# Patient Record
Sex: Female | Born: 1955 | Race: Black or African American | Hispanic: No | Marital: Single | Smoking: Never smoker
Health system: Southern US, Community
[De-identification: ages and names within clinical notes are randomized; demographics above are authoritative.]

## PROBLEM LIST (undated history)

## (undated) DIAGNOSIS — I10 Essential (primary) hypertension: Secondary | ICD-10-CM

## (undated) DIAGNOSIS — E785 Hyperlipidemia, unspecified: Secondary | ICD-10-CM

## (undated) HISTORY — PX: ABDOMINAL HYSTERECTOMY: SHX81

---

## 2019-11-23 ENCOUNTER — Encounter (HOSPITAL_BASED_OUTPATIENT_CLINIC_OR_DEPARTMENT_OTHER): Payer: Self-pay | Admitting: *Deleted

## 2019-11-23 ENCOUNTER — Other Ambulatory Visit: Payer: Self-pay

## 2019-11-23 ENCOUNTER — Emergency Department (HOSPITAL_BASED_OUTPATIENT_CLINIC_OR_DEPARTMENT_OTHER)
Admission: EM | Admit: 2019-11-23 | Discharge: 2019-11-23 | Disposition: A | Discharge status: Home / Self Care | Payer: BC Managed Care – PPO | Attending: Emergency Medicine | Admitting: Emergency Medicine

## 2019-11-23 ENCOUNTER — Emergency Department (HOSPITAL_BASED_OUTPATIENT_CLINIC_OR_DEPARTMENT_OTHER): Payer: BC Managed Care – PPO

## 2019-11-23 DIAGNOSIS — Z20822 Contact with and (suspected) exposure to covid-19: Secondary | ICD-10-CM | POA: Diagnosis not present

## 2019-11-23 DIAGNOSIS — J069 Acute upper respiratory infection, unspecified: Secondary | ICD-10-CM | POA: Insufficient documentation

## 2019-11-23 DIAGNOSIS — R05 Cough: Secondary | ICD-10-CM | POA: Diagnosis present

## 2019-11-23 DIAGNOSIS — Z79899 Other long term (current) drug therapy: Secondary | ICD-10-CM | POA: Diagnosis not present

## 2019-11-23 DIAGNOSIS — R059 Cough, unspecified: Secondary | ICD-10-CM

## 2019-11-23 LAB — COMPREHENSIVE METABOLIC PANEL
ALT: 16 U/L (ref 0–44)
AST: 14 U/L — ABNORMAL LOW (ref 15–41)
Albumin: 3.6 g/dL (ref 3.5–5.0)
Alkaline Phosphatase: 97 U/L (ref 38–126)
Anion gap: 9 (ref 5–15)
BUN: 16 mg/dL (ref 8–23)
CO2: 27 mmol/L (ref 22–32)
Calcium: 8.8 mg/dL — ABNORMAL LOW (ref 8.9–10.3)
Chloride: 104 mmol/L (ref 98–111)
Creatinine, Ser: 0.66 mg/dL (ref 0.44–1.00)
GFR calc Af Amer: >60 mL/min (ref >60)
GFR calc non Af Amer: >60 mL/min (ref >60)
Glucose, Bld: 89 mg/dL (ref 70–99)
Potassium: 3.6 mmol/L (ref 3.5–5.1)
Sodium: 140 mmol/L (ref 135–145)
Total Bilirubin: 0.5 mg/dL (ref 0.3–1.2)
Total Protein: 7.3 g/dL (ref 6.5–8.1)

## 2019-11-23 LAB — CBC WITH DIFFERENTIAL/PLATELET
Abs Immature Granulocytes: 0.1 10*3/uL — ABNORMAL HIGH (ref 0.00–0.07)
Basophils Absolute: 0.1 10*3/uL (ref 0.0–0.1)
Basophils Relative: 1 %
Eosinophils Absolute: 0.2 10*3/uL (ref 0.0–0.5)
Eosinophils Relative: 1 %
HCT: 38.7 % (ref 36.0–46.0)
Hemoglobin: 12.8 g/dL (ref 12.0–15.0)
Immature Granulocytes: 1 %
Lymphocytes Relative: 26 %
Lymphs Abs: 3.8 10*3/uL (ref 0.7–4.0)
MCH: 29.5 pg (ref 26.0–34.0)
MCHC: 33.1 g/dL (ref 30.0–36.0)
MCV: 89.2 fL (ref 80.0–100.0)
Monocytes Absolute: 1 10*3/uL (ref 0.1–1.0)
Monocytes Relative: 7 %
Neutro Abs: 9.6 10*3/uL — ABNORMAL HIGH (ref 1.7–7.7)
Neutrophils Relative %: 64 %
Platelets: 309 10*3/uL (ref 150–400)
RBC: 4.34 MIL/uL (ref 3.87–5.11)
RDW: 14.3 % (ref 11.5–15.5)
WBC: 14.7 10*3/uL — ABNORMAL HIGH (ref 4.0–10.5)
nRBC: 0 % (ref 0.0–0.2)

## 2019-11-23 LAB — D-DIMER, QUANTITATIVE: D-Dimer, Quant: 0.33 ug/mL-FEU (ref 0.00–0.50)

## 2019-11-23 LAB — TROPONIN I (HIGH SENSITIVITY): Troponin I (High Sensitivity): 4 ng/L (ref <18)

## 2019-11-23 LAB — SARS CORONAVIRUS 2 BY RT PCR (HOSPITAL ORDER, PERFORMED IN ~~LOC~~ HOSPITAL LAB): SARS Coronavirus 2: NEGATIVE

## 2019-11-23 MED ORDER — BENZONATATE 100 MG PO CAPS: 100.0000 mg | ORAL_CAPSULE | Freq: Three times a day (TID) | ORAL | 0 refills | Status: AC

## 2019-11-23 MED ORDER — FLUTICASONE PROPIONATE 50 MCG/ACT NA SUSP
1.0000 | Freq: Every day | NASAL | 0 refills | Status: AC
Start: 2019-11-23 — End: ?

## 2019-11-23 MED ORDER — ALBUTEROL SULFATE HFA 108 (90 BASE) MCG/ACT IN AERS
2.0000 | INHALATION_SPRAY | Freq: Once | RESPIRATORY_TRACT | Status: AC
  Administered 2019-11-23: 2 via RESPIRATORY_TRACT
  Filled 2019-11-23: qty 6.7

## 2019-11-23 NOTE — Discharge Instructions (Signed)
You likely have a viral illness.  This should be treated symptomatically. Use the albuterol inhaler every 4 hours for the next 2 days.  After this, use as needed for shortness of breath, wheezing, or coughing fits. Use Tylenol or ibuprofen as needed for chest pain, body aches, or fever. Use Flonase daily for nasal congestion and cough. Use tessalon to help with cough Make sure you stay well-hydrated with water. Wash your hands frequently to prevent spread of infection. Follow-up with your primary care doctor in 1 week if your symptoms are not improving. Return to the emergency room if you develop severe/worsening chest pain, difficulty breathing, or any new or worsening symptoms.

## 2019-11-23 NOTE — ED Triage Notes (Signed)
Cough x 5 days. Sore throat. Runny nose, chills, headache. She was seen by her MD 2 weeks ago and started on Prednisone for inflammation in her chest.

## 2019-11-23 NOTE — ED Provider Notes (Signed)
[facility] EMERGENCY DEPARTMENT Provider Note   CSN: [id number] Arrival date & time: 11/23/19  1148     History Chief Complaint  Patient presents with  . Cough    Renee Patel is a 64 y.o. female presenting for evaluation of cough, left-sided chest pain, nasal congestion, and headache.  Patient states 2 weeks ago she developed left-sided chest pain that radiates to her back.  It is constant, nothing makes it better or worse.  She is seen by her PCP, had a normal x-ray and EKG.  It was presumed to be pleurisy, and she was started on prednisone.  Patient says she took the entire course, without improvement of her symptoms.  She was documenting albuterol or antibiotics at that time.  5 days ago she developed a cough productive mucus, nasal congestion, headache.  She has not been taking anything for her symptoms.  As her symptoms not improve, is encouraged that she come to the ER for further evaluation.  She denies fevers or chills.  She denies shortness of breath.  No increased pain with inspiration.  She denies sick contacts.  She has received both Covid vaccines more than 2 weeks ago.  She denies a history of asthma or COPD, however has not needed to use albuterol in the past for bronchitis.  He denies recent travel, surgeries, immobilization, history of cancer, history of previous DVT/PE, or hormone use.  HPI     History reviewed. No pertinent past medical history.  There are no problems to display for this patient.   Past Surgical History:  Procedure Laterality Date  . ABDOMINAL HYSTERECTOMY       OB History   No obstetric history on file.     No family history on file.  Social History   Tobacco Use  . Smoking status: Never Smoker  . Smokeless tobacco: Never Used  Substance Use Topics  . Alcohol use: Never  . Drug use: Never    Home Medications Prior to Admission medications   Medication Sig Start Date End Date Taking? Authorizing Provider  benzonatate  (TESSALON) 100 MG capsule Take 1 capsule (100 mg total) by mouth every 8 (eight) hours. 11/23/19   Aiko Belko, PA-C  fluticasone (FLONASE) 50 MCG/ACT nasal spray Place 1 spray into both nostrils daily. 11/23/19   Khailee Mick, PA-C    Allergies    Patient has no known allergies.  Review of Systems   Review of Systems  HENT: Positive for congestion.   Respiratory: Positive for cough.   Cardiovascular: Positive for chest pain.  Neurological: Positive for headaches.  All other systems reviewed and are negative.   Physical Exam Updated Vital Signs BP (!) 169/95   Pulse 67   Temp 98.7 F (37.1 C) (Oral)   Resp 19   Ht 5\' 7"  (1.702 m)   Wt 81.6 kg   SpO2 100%   BMI 28.19 kg/m   Physical Exam Vitals and nursing note reviewed.  Constitutional:      General: She is not in acute distress.    Appearance: She is well-developed.     Comments: Appears nontoxic  HENT:     Head: Normocephalic and atraumatic.     Comments: OP clear without tonsillar swelling or exudate.  Uvula midline with equal palate rise. Eyes:     Extraocular Movements: Extraocular movements intact.     Conjunctiva/sclera: Conjunctivae normal.     Pupils: Pupils are equal, round, and reactive to light.  Cardiovascular:  Rate and Rhythm: Normal rate and regular rhythm.     Pulses: Normal pulses.  Pulmonary:     Effort: Pulmonary effort is normal. No respiratory distress.     Breath sounds: Normal breath sounds. No wheezing.     Comments: Speaking full sentences.  Clear lung sounds in all fields.  No wheezing, rales, rhonchi.  With inspiration, patient has coughing fits.  Mild tenderness palpation of the anterior chest wall on the left side. Chest:     Chest wall: Tenderness present.  Abdominal:     General: There is no distension.     Palpations: Abdomen is soft. There is no mass.     Tenderness: There is no abdominal tenderness. There is no guarding or rebound.  Musculoskeletal:        General:  Normal range of motion.     Cervical back: Normal range of motion and neck supple.     Right lower leg: No edema.     Left lower leg: No edema.     Comments: No leg pain or swelling  Skin:    General: Skin is warm and dry.     Capillary Refill: Capillary refill takes less than 2 seconds.  Neurological:     Mental Status: She is alert and oriented to person, place, and time.     ED Results / Procedures / Treatments   Labs (all labs ordered are listed, but only abnormal results are displayed) Labs Reviewed  CBC WITH DIFFERENTIAL/PLATELET - Abnormal; Notable for the following components:      Result Value   WBC 14.7 (*)    Neutro Abs 9.6 (*)    Abs Immature Granulocytes 0.10 (*)    All other components within normal limits  COMPREHENSIVE METABOLIC PANEL - Abnormal; Notable for the following components:   Calcium 8.8 (*)    AST 14 (*)    All other components within normal limits  SARS CORONAVIRUS 2 BY RT PCR (HOSPITAL ORDER, PERFORMED IN [facility])  D-DIMER, QUANTITATIVE (NOT AT [facility])  TROPONIN I (HIGH SENSITIVITY)  TROPONIN I (HIGH SENSITIVITY)    EKG None  Radiology DG Chest Port 1 View  Result Date: 11/23/2019 CLINICAL DATA:  Cough and chills EXAM: PORTABLE CHEST 1 VIEW COMPARISON:  Nov 04, 2019 FINDINGS: The lungs are clear. The heart size and pulmonary vascularity are normal. No adenopathy. No bone lesions. IMPRESSION: Lungs clear.  Cardiac silhouette within normal limits. Electronically Signed   By: Bretta Bang III M.D.   On: 11/23/2019 13:09    Procedures Procedures (including critical care time)  Medications Ordered in ED Medications  albuterol (VENTOLIN HFA) 108 (90 Base) MCG/ACT inhaler 2 puff (2 puffs Inhalation Given 11/23/19 1311)    ED Course  I have reviewed the triage vital signs and the nursing notes.  Pertinent labs & imaging results that were available during my care of the patient were reviewed by me and considered in my medical  decision making (see chart for details).    MDM Rules/Calculators/A&P                      Patient presented for evaluation of cough, headache, nasal congestion, and chest pain.  On exam, patient appears nontoxic.  Pain is reproducible with palpation of the chest wall, and has been present for several weeks.  As such, likely pleurisy/inflammation, especially in the setting of cough and nasal congestion.  However, symptoms are not improving, will obtain D-dimer to  rule out PE.  Low suspicion for ACS, will rule out with EKG and troponin.  Will repeat chest x-ray today, as patient has new symptoms of cough to rule out pneumonia.  Will give albuterol and reassess.  X-ray viewed interpreted by me, no pneumonia, no thorax, effusion, cardiomegaly.  D-dimer negative.  Initial troponin IV, EKG without STEMI.  As such, I do not believe she needs repeat troponin, several suspicion for ACS.  Labs otherwise reassuring.  On reassessment after albuterol, patient reports significant improvement in cough.  Likely bronchitis/other viral illness.  Covid test is negative.  As patient is afebrile, I do not believe she needs antibiotics.  Discussed symptomatic treatment with albuterol, Tessalon, nasal sprays.  Discussed follow-up with primary care as needed if symptoms not proving.  At this time, patient appears safe for discharge.  Return precautions given.  Patient states she understands and agrees to plan.   Final Clinical Impression(s) / ED Diagnoses Final diagnoses:  Cough  Upper respiratory tract infection, unspecified type    Rx / DC Orders ED Discharge Orders         Ordered    fluticasone (FLONASE) 50 MCG/ACT nasal spray  Daily     11/23/19 1416    benzonatate (TESSALON) 100 MG capsule  Every 8 hours     11/23/19 1416           Adaline Trejos, PA-C 11/23/19 1444    Tegeler, Canary Brim, MD 11/23/19 1520

## 2020-05-22 ENCOUNTER — Other Ambulatory Visit: Payer: Self-pay

## 2020-05-22 ENCOUNTER — Encounter (HOSPITAL_BASED_OUTPATIENT_CLINIC_OR_DEPARTMENT_OTHER): Payer: Self-pay | Admitting: Emergency Medicine

## 2020-05-22 ENCOUNTER — Emergency Department (HOSPITAL_BASED_OUTPATIENT_CLINIC_OR_DEPARTMENT_OTHER)
Admission: EM | Admit: 2020-05-22 | Discharge: 2020-05-22 | Disposition: A | Discharge status: Home / Self Care | Payer: BC Managed Care – PPO | Attending: Emergency Medicine | Admitting: Emergency Medicine

## 2020-05-22 DIAGNOSIS — M545 Low back pain, unspecified: Secondary | ICD-10-CM

## 2020-05-22 DIAGNOSIS — M62830 Muscle spasm of back: Secondary | ICD-10-CM | POA: Diagnosis not present

## 2020-05-22 DIAGNOSIS — M62838 Other muscle spasm: Secondary | ICD-10-CM

## 2020-05-22 MED ORDER — HYDROCODONE-ACETAMINOPHEN 5-325 MG PO TABS: 1.0000 | ORAL_TABLET | ORAL | 0 refills | Status: AC | PRN

## 2020-05-22 MED ORDER — HYDROMORPHONE HCL 1 MG/ML IJ SOLN
1.0000 mg | Freq: Once | INTRAMUSCULAR | Status: AC
  Administered 2020-05-22: 1 mg via INTRAMUSCULAR
  Filled 2020-05-22: qty 1

## 2020-05-22 MED ORDER — KETOROLAC TROMETHAMINE 60 MG/2ML IM SOLN
60.0000 mg | Freq: Once | INTRAMUSCULAR | Status: AC
  Administered 2020-05-22: 60 mg via INTRAMUSCULAR
  Filled 2020-05-22: qty 2

## 2020-05-22 MED ORDER — METHOCARBAMOL 500 MG PO TABS: 500.0000 mg | ORAL_TABLET | Freq: Four times a day (QID) | ORAL | 0 refills | Status: AC

## 2020-05-22 NOTE — ED Provider Notes (Signed)
[facility] EMERGENCY DEPARTMENT Provider Note   CSN: [id number] Arrival date & time: 05/22/20  2101     History Chief Complaint  Patient presents with  . Back Pain    Renee Patel is a 64 y.o. female.  The history is provided by the patient.  Back Pain Location:  Lumbar spine Quality:  Aching Pain severity:  Severe Pain is:  Same all the time Timing:  Constant Progression:  Worsening Chronicity:  New Relieved by:  Nothing Worsened by:  Nothing Ineffective treatments:  Ibuprofen and muscle relaxants Associated symptoms: no weakness   Risk factors: no recent surgery   Pt complains of pain in her back.  Pt thinks she may have pulled something.  Pt complains of pain upper left back to lower back.  Pain with movement. nv and ns intact     History reviewed. No pertinent past medical history.  There are no problems to display for this patient.   Past Surgical History:  Procedure Laterality Date  . ABDOMINAL HYSTERECTOMY       OB History   No obstetric history on file.     No family history on file.  Social History   Tobacco Use  . Smoking status: Never Smoker  . Smokeless tobacco: Never Used  Substance Use Topics  . Alcohol use: Never  . Drug use: Never    Home Medications Prior to Admission medications   Medication Sig Start Date End Date Taking? Authorizing Provider  benzonatate (TESSALON) 100 MG capsule Take 1 capsule (100 mg total) by mouth every 8 (eight) hours. 11/23/19   Caccavale, Sophia, PA-C  fluticasone (FLONASE) 50 MCG/ACT nasal spray Place 1 spray into both nostrils daily. 11/23/19   Caccavale, Sophia, PA-C    Allergies    Patient has no known allergies.  Review of Systems   Review of Systems  Musculoskeletal: Positive for back pain.  Neurological: Negative for weakness.  All other systems reviewed and are negative.   Physical Exam Updated Vital Signs BP (!) 151/90 (BP Location: Left Arm)   Pulse 87   Temp 98.2 F (36.8  C) (Oral)   Resp 18   Ht 5' 7.5" (1.715 m)   Wt 83.9 kg   SpO2 98%   BMI 28.55 kg/m   Physical Exam Vitals and nursing note reviewed.  Constitutional:      Appearance: She is well-developed.  HENT:     Head: Normocephalic.  Cardiovascular:     Rate and Rhythm: Normal rate.  Pulmonary:     Effort: Pulmonary effort is normal.  Abdominal:     General: There is no distension.  Musculoskeletal:        General: Normal range of motion.     Cervical back: Normal range of motion.  Skin:    General: Skin is warm.  Neurological:     General: No focal deficit present.     Mental Status: She is alert and oriented to person, place, and time.  Psychiatric:        Mood and Affect: Mood normal.     ED Results / Procedures / Treatments   Labs (all labs ordered are listed, but only abnormal results are displayed) Labs Reviewed - No data to display  EKG None  Radiology No results found.  Procedures Procedures (including critical care time)  Medications Ordered in ED Medications  ketorolac (TORADOL) injection 60 mg (60 mg Intramuscular Given 05/22/20 2203)  HYDROmorphone (DILAUDID) injection 1 mg (1 mg Intramuscular Given 05/22/20  2203)    ED Course  I have reviewed the triage vital signs and the nursing notes.  Pertinent labs & imaging results that were available during my care of the patient were reviewed by me and considered in my medical decision making (see chart for details).    MDM Rules/Calculators/A&P                          MDM:  Pt reports feeling better,  Pt reexamined.  No rash.  Pt advised to follow up with her MD for recheck in 2-3 dasys  Final Clinical Impression(s) / ED Diagnoses Final diagnoses:  Acute low back pain without sciatica, unspecified back pain laterality  Muscle spasm    Rx / DC Orders ED Discharge Orders         Ordered    HYDROcodone-acetaminophen (NORCO/VICODIN) 5-325 MG tablet  Every 4 hours PRN        05/22/20 2311     methocarbamol (ROBAXIN) 500 MG tablet  4 times daily        05/22/20 2311        An After Visit Summary was printed and given to the patient.    Osie Cheeks 05/22/20 2312    Benjiman Core, MD 05/22/20 2342

## 2020-05-22 NOTE — ED Triage Notes (Signed)
Reports left sided back pain for the last two weeks.  Endorses helping her mother with ADL's which may have pulled something,  Pain described as spasms.

## 2021-10-06 IMAGING — DX DG CHEST 1V PORT
1 series · 1 of 1 positions shown · non-contrast
Comparison: November 04, 2019

CLINICAL DATA: Cough and chills

EXAM:
PORTABLE CHEST 1 VIEW

[chest ap]
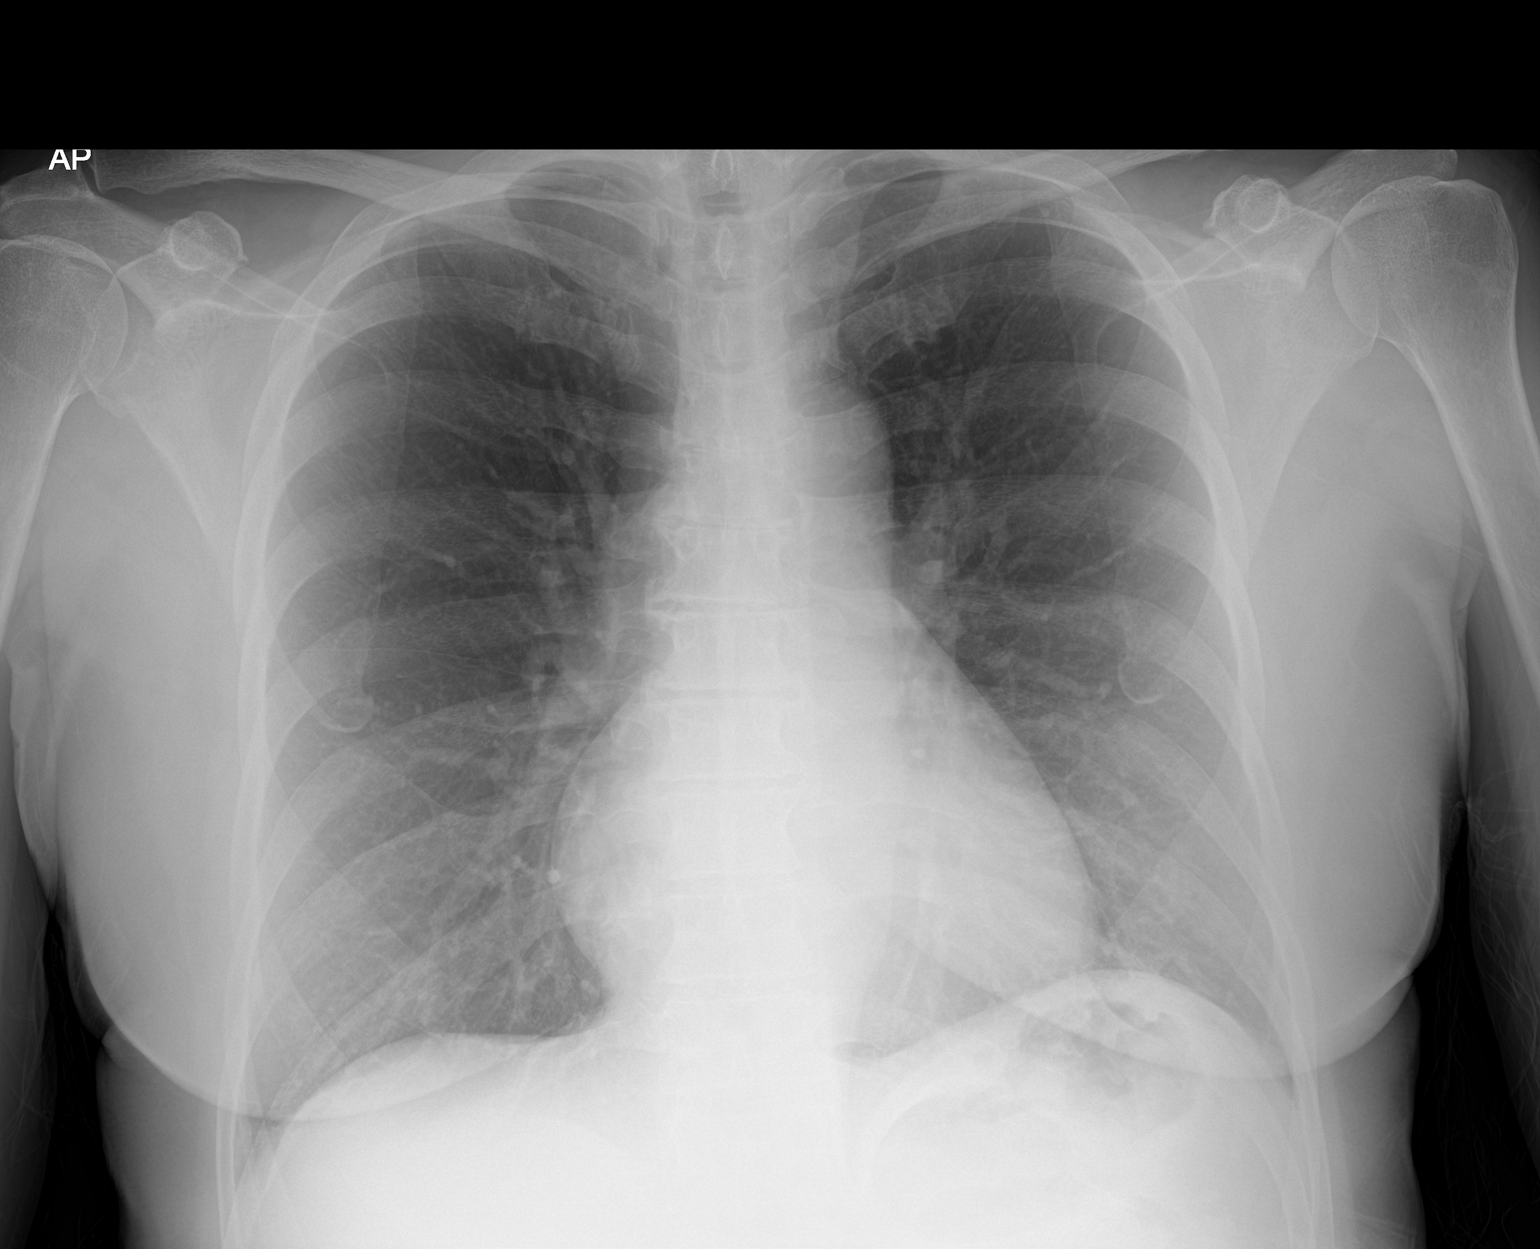

[1 of 1 positions shown; findings below may reference images not displayed]

FINDINGS: The lungs are clear. The heart size and pulmonary vascularity are
normal. No adenopathy. No bone lesions.
IMPRESSION: Lungs clear.  Cardiac silhouette within normal limits.

## 2022-07-28 ENCOUNTER — Encounter (HOSPITAL_BASED_OUTPATIENT_CLINIC_OR_DEPARTMENT_OTHER): Payer: Self-pay | Admitting: Emergency Medicine

## 2022-07-28 ENCOUNTER — Other Ambulatory Visit: Payer: Self-pay

## 2022-07-28 ENCOUNTER — Emergency Department (HOSPITAL_BASED_OUTPATIENT_CLINIC_OR_DEPARTMENT_OTHER)
Admission: EM | Admit: 2022-07-28 | Discharge: 2022-07-28 | Disposition: A | Discharge status: Home / Self Care | Payer: BLUE CROSS/BLUE SHIELD | Attending: Emergency Medicine | Admitting: Emergency Medicine

## 2022-07-28 DIAGNOSIS — M545 Low back pain, unspecified: Secondary | ICD-10-CM | POA: Diagnosis present

## 2022-07-28 DIAGNOSIS — M5441 Lumbago with sciatica, right side: Secondary | ICD-10-CM | POA: Diagnosis not present

## 2022-07-28 DIAGNOSIS — M5431 Sciatica, right side: Secondary | ICD-10-CM

## 2022-07-28 HISTORY — DX: Hyperlipidemia, unspecified: E78.5

## 2022-07-28 HISTORY — DX: Essential (primary) hypertension: I10

## 2022-07-28 MED ORDER — METHYLPREDNISOLONE 4 MG PO TBPK: ORAL_TABLET | ORAL | 0 refills | Status: AC

## 2022-07-28 MED ORDER — OXYCODONE HCL 5 MG PO TABS
5.0000 mg | ORAL_TABLET | Freq: Once | ORAL | Status: AC
  Administered 2022-07-28: 5 mg via ORAL
  Filled 2022-07-28: qty 1

## 2022-07-28 MED ORDER — DIAZEPAM 2 MG PO TABS
2.0000 mg | ORAL_TABLET | Freq: Once | ORAL | Status: AC
  Administered 2022-07-28: 2 mg via ORAL
  Filled 2022-07-28: qty 1

## 2022-07-28 MED ORDER — ACETAMINOPHEN 500 MG PO TABS
1000.0000 mg | ORAL_TABLET | Freq: Once | ORAL | Status: AC
  Administered 2022-07-28: 1000 mg via ORAL
  Filled 2022-07-28: qty 2

## 2022-07-28 MED ORDER — KETOROLAC TROMETHAMINE 15 MG/ML IJ SOLN
15.0000 mg | Freq: Once | INTRAMUSCULAR | Status: AC
  Administered 2022-07-28: 15 mg via INTRAMUSCULAR
  Filled 2022-07-28: qty 1

## 2022-07-28 NOTE — ED Provider Notes (Signed)
[facility] Provider Note   CSN: [id number] Arrival date & time: 07/28/22  1314     History  Chief Complaint  Patient presents with   Back Pain    Renee Patel is a 67 y.o. female.  67 yo F with a cc of right-sided low back pain.  This been going on for a few days now.  She tells me that she woke up and felt like a catch in her back and then she tried to do some lunges and strengthen the area and ended up having some worsening pain.  Radiates down the leg.  She denies trauma denies loss of bowel or bladder denies  Sensation denies numbness or weakness to the leg.   Back Pain      Home Medications Prior to Admission medications   Medication Sig Start Date End Date Taking? Authorizing Provider  methylPREDNISolone (MEDROL DOSEPAK) 4 MG TBPK tablet Day 1: 8mg  before breakfast, 4 mg after lunch, 4 mg after supper, and 8 mg at bedtime Day 2: 4 mg before breakfast, 4 mg after lunch, 4 mg  after supper, and 8 mg  at bedtime Day 3:  4 mg  before breakfast, 4 mg  after lunch, 4 mg after supper, and 4 mg  at bedtime Day 4: 4 mg  before breakfast, 4 mg  after lunch, and 4 mg at bedtime Day 5: 4 mg  before breakfast and 4 mg at bedtime Day 6: 4 mg  before breakfast 07/28/22  Yes Deno Etienne, DO  benzonatate (TESSALON) 100 MG capsule Take 1 capsule (100 mg total) by mouth every 8 (eight) hours. 11/23/19   Caccavale, Sophia, PA-C  fluticasone (FLONASE) 50 MCG/ACT nasal spray Place 1 spray into both nostrils daily. 11/23/19   Caccavale, Sophia, PA-C  methocarbamol (ROBAXIN) 500 MG tablet Take 1 tablet (500 mg total) by mouth 4 (four) times daily. 05/22/20   Fransico Meadow, PA-C      Allergies    Nabumetone    Review of Systems   Review of Systems  Musculoskeletal:  Positive for back pain.    Physical Exam Updated Vital Signs BP (!) 132/92 (BP Location: Right Arm)   Pulse (!) 104   Temp 98.9 F (37.2 C) (Oral)   Resp 17   Ht 5\' 7"  (1.702 m)    Wt 78.9 kg   SpO2 98%   BMI 27.25 kg/m  Physical Exam Vitals and nursing note reviewed.  Constitutional:      General: She is not in acute distress.    Appearance: She is well-developed. She is not diaphoretic.  HENT:     Head: Normocephalic and atraumatic.  Eyes:     Pupils: Pupils are equal, round, and reactive to light.  Cardiovascular:     Rate and Rhythm: Normal rate and regular rhythm.     Heart sounds: No murmur heard.    No friction rub. No gallop.  Pulmonary:     Effort: Pulmonary effort is normal.     Breath sounds: No wheezing or rales.  Abdominal:     General: There is no distension.     Palpations: Abdomen is soft.     Tenderness: There is no abdominal tenderness.  Musculoskeletal:        General: No tenderness.     Cervical back: Normal range of motion and neck supple.     Comments: Pulse motor and sensation intact in the right lower extremity.  Reflexes are  2+ and equal.  No clonus.  Skin:    General: Skin is warm and dry.  Neurological:     Mental Status: She is alert and oriented to person, place, and time.  Psychiatric:        Behavior: Behavior normal.     ED Results / Procedures / Treatments   Labs (all labs ordered are listed, but only abnormal results are displayed) Labs Reviewed - No data to display  EKG None  Radiology No results found.  Procedures Procedures    Medications Ordered in ED Medications  acetaminophen (TYLENOL) tablet 1,000 mg (has no administration in time range)  oxyCODONE (Oxy IR/ROXICODONE) immediate release tablet 5 mg (has no administration in time range)  ketorolac (TORADOL) 15 MG/ML injection 15 mg (has no administration in time range)  diazepam (VALIUM) tablet 2 mg (has no administration in time range)    ED Course/ Medical Decision Making/ A&P                             Medical Decision Making Risk OTC drugs. Prescription drug management.   67 yo F with a chief complaints of right-sided low back pain  that radiates down the leg.  This has been going on for a few days now.  She was having some initial discomfort and then try to do some lunges to see if that would help but it made things worse.  She has a benign exam here.  Is atraumatic.  Will treat as musculoskeletal.  Burst dose steroids if she has had no improvement with meloxicam at home.  PCP follow-up.  1:38 PM:  I have discussed the diagnosis/risks/treatment options with the patient and family.  Evaluation and diagnostic testing in the emergency department does not suggest an emergent condition requiring admission or immediate intervention beyond what has been performed at this time.  They will follow up with PCP. We also discussed returning to the ED immediately if new or worsening sx occur. We discussed the sx which are most concerning (e.g., sudden worsening pain, fever, inability to tolerate by mouth, cauda equina s/sx) that necessitate immediate return. Medications administered to the patient during their visit and any new prescriptions provided to the patient are listed below.  Medications given during this visit Medications  acetaminophen (TYLENOL) tablet 1,000 mg (has no administration in time range)  oxyCODONE (Oxy IR/ROXICODONE) immediate release tablet 5 mg (has no administration in time range)  ketorolac (TORADOL) 15 MG/ML injection 15 mg (has no administration in time range)  diazepam (VALIUM) tablet 2 mg (has no administration in time range)     The patient appears reasonably screen and/or stabilized for discharge and I doubt any other medical condition or other [facility] requiring further screening, evaluation, or treatment in the ED at this time prior to discharge.          Final Clinical Impression(s) / ED Diagnoses Final diagnoses:  Sciatica of right side    Rx / DC Orders ED Discharge Orders          Ordered    methylPREDNISolone (MEDROL DOSEPAK) 4 MG TBPK tablet        07/28/22 1335              Deno Etienne, DO 07/28/22 1338

## 2022-07-28 NOTE — ED Notes (Signed)
ED Provider at bedside. 

## 2022-07-28 NOTE — Discharge Instructions (Addendum)
Your back pain is most likely due to a muscular strain.  There is been a lot of research on back pain, unfortunately the only thing that seems to really help is Tylenol and ibuprofen.  Relative rest is also important to not lift greater than 10 pounds bending or twisting at the waist.  Please follow-up with your family physician.  The other thing that really seems to benefit patients is physical therapy which your doctor may send you for.  Please return to the emergency department for new numbness or weakness to your arms or legs. Difficulty with urinating or urinating or pooping on yourself.  Also if you cannot feel toilet paper when you wipe or get a fever.   Take your steroids as prescribed.  You should not take any NSAIDs while you are taking the steroid as this can significantly increase your risk of reflux disease. Also take tylenol 1000mg (2 extra strength) four times a day.   Stretches tend to help quite a bit [email or web address]

## 2022-07-28 NOTE — ED Triage Notes (Signed)
Pt with RT side lower back pain since Wed, now radiating down leg; no injury

## 2023-09-09 DIAGNOSIS — R0600 Dyspnea, unspecified: Secondary | ICD-10-CM | POA: Diagnosis not present

## 2023-10-10 DIAGNOSIS — R002 Palpitations: Secondary | ICD-10-CM | POA: Diagnosis not present

## 2023-10-10 DIAGNOSIS — K8020 Calculus of gallbladder without cholecystitis without obstruction: Secondary | ICD-10-CM | POA: Diagnosis not present

## 2023-10-10 DIAGNOSIS — I10 Essential (primary) hypertension: Secondary | ICD-10-CM | POA: Diagnosis not present

## 2023-10-10 DIAGNOSIS — G4733 Obstructive sleep apnea (adult) (pediatric): Secondary | ICD-10-CM | POA: Diagnosis not present

## 2023-10-10 DIAGNOSIS — R0602 Shortness of breath: Secondary | ICD-10-CM | POA: Diagnosis not present

## 2023-11-19 DIAGNOSIS — K582 Mixed irritable bowel syndrome: Secondary | ICD-10-CM | POA: Diagnosis not present

## 2023-11-19 DIAGNOSIS — E785 Hyperlipidemia, unspecified: Secondary | ICD-10-CM | POA: Diagnosis not present

## 2023-11-19 DIAGNOSIS — Z8719 Personal history of other diseases of the digestive system: Secondary | ICD-10-CM | POA: Diagnosis not present

## 2023-11-19 DIAGNOSIS — Z6830 Body mass index (BMI) 30.0-30.9, adult: Secondary | ICD-10-CM | POA: Diagnosis not present

## 2023-11-19 DIAGNOSIS — I10 Essential (primary) hypertension: Secondary | ICD-10-CM | POA: Diagnosis not present

## 2023-11-19 DIAGNOSIS — J302 Other seasonal allergic rhinitis: Secondary | ICD-10-CM | POA: Diagnosis not present

## 2023-11-19 DIAGNOSIS — R000 Tachycardia, unspecified: Secondary | ICD-10-CM | POA: Diagnosis not present

## 2023-11-19 DIAGNOSIS — G4733 Obstructive sleep apnea (adult) (pediatric): Secondary | ICD-10-CM | POA: Diagnosis not present

## 2023-11-19 DIAGNOSIS — E669 Obesity, unspecified: Secondary | ICD-10-CM | POA: Diagnosis not present

## 2023-11-19 DIAGNOSIS — Z0000 Encounter for general adult medical examination without abnormal findings: Secondary | ICD-10-CM | POA: Diagnosis not present

## 2023-12-06 DIAGNOSIS — M8589 Other specified disorders of bone density and structure, multiple sites: Secondary | ICD-10-CM | POA: Diagnosis not present

## 2023-12-06 DIAGNOSIS — R600 Localized edema: Secondary | ICD-10-CM | POA: Diagnosis not present

## 2023-12-06 DIAGNOSIS — I10 Essential (primary) hypertension: Secondary | ICD-10-CM | POA: Diagnosis not present

## 2023-12-06 DIAGNOSIS — R002 Palpitations: Secondary | ICD-10-CM | POA: Diagnosis not present

## 2023-12-20 DIAGNOSIS — R350 Frequency of micturition: Secondary | ICD-10-CM | POA: Diagnosis not present

## 2023-12-20 DIAGNOSIS — R319 Hematuria, unspecified: Secondary | ICD-10-CM | POA: Diagnosis not present

## 2023-12-20 DIAGNOSIS — N952 Postmenopausal atrophic vaginitis: Secondary | ICD-10-CM | POA: Diagnosis not present

## 2023-12-20 DIAGNOSIS — R1031 Right lower quadrant pain: Secondary | ICD-10-CM | POA: Diagnosis not present

## 2023-12-20 DIAGNOSIS — M79671 Pain in right foot: Secondary | ICD-10-CM | POA: Diagnosis not present

## 2023-12-20 DIAGNOSIS — R3915 Urgency of urination: Secondary | ICD-10-CM | POA: Diagnosis not present

## 2023-12-20 DIAGNOSIS — M79672 Pain in left foot: Secondary | ICD-10-CM | POA: Diagnosis not present

## 2024-01-13 NOTE — Progress Notes (Signed)
 7 Day Zio patch applied per Thersia Pizza, MD for palpitations.  Zio patch was applied by Meade Lied, CMA.

## 2024-01-16 ENCOUNTER — Emergency Department (HOSPITAL_BASED_OUTPATIENT_CLINIC_OR_DEPARTMENT_OTHER)
Admission: EM | Admit: 2024-01-16 | Discharge: 2024-01-16 | Disposition: A | Discharge status: Home / Self Care | Payer: Medicare (Managed Care) | Source: Ambulatory Visit | Attending: Emergency Medicine | Admitting: Emergency Medicine

## 2024-01-16 ENCOUNTER — Encounter (HOSPITAL_BASED_OUTPATIENT_CLINIC_OR_DEPARTMENT_OTHER): Payer: Self-pay

## 2024-01-16 ENCOUNTER — Emergency Department (HOSPITAL_BASED_OUTPATIENT_CLINIC_OR_DEPARTMENT_OTHER): Payer: Medicare (Managed Care)

## 2024-01-16 ENCOUNTER — Other Ambulatory Visit: Payer: Self-pay

## 2024-01-16 DIAGNOSIS — K1121 Acute sialoadenitis: Secondary | ICD-10-CM | POA: Insufficient documentation

## 2024-01-16 DIAGNOSIS — K1120 Sialoadenitis, unspecified: Secondary | ICD-10-CM | POA: Diagnosis not present

## 2024-01-16 DIAGNOSIS — M799 Soft tissue disorder, unspecified: Secondary | ICD-10-CM | POA: Diagnosis not present

## 2024-01-16 DIAGNOSIS — M47819 Spondylosis without myelopathy or radiculopathy, site unspecified: Secondary | ICD-10-CM | POA: Diagnosis not present

## 2024-01-16 DIAGNOSIS — I10 Essential (primary) hypertension: Secondary | ICD-10-CM | POA: Diagnosis not present

## 2024-01-16 DIAGNOSIS — E042 Nontoxic multinodular goiter: Secondary | ICD-10-CM | POA: Diagnosis not present

## 2024-01-16 DIAGNOSIS — R319 Hematuria, unspecified: Secondary | ICD-10-CM | POA: Diagnosis not present

## 2024-01-16 DIAGNOSIS — K219 Gastro-esophageal reflux disease without esophagitis: Secondary | ICD-10-CM | POA: Diagnosis not present

## 2024-01-16 DIAGNOSIS — R221 Localized swelling, mass and lump, neck: Secondary | ICD-10-CM | POA: Diagnosis not present

## 2024-01-16 DIAGNOSIS — G629 Polyneuropathy, unspecified: Secondary | ICD-10-CM | POA: Diagnosis not present

## 2024-01-16 DIAGNOSIS — R591 Generalized enlarged lymph nodes: Secondary | ICD-10-CM

## 2024-01-16 DIAGNOSIS — R590 Localized enlarged lymph nodes: Secondary | ICD-10-CM | POA: Diagnosis not present

## 2024-01-16 LAB — CBC WITH DIFFERENTIAL/PLATELET
Abs Immature Granulocytes: 0.03 K/uL (ref 0.00–0.07)
Basophils Absolute: 0 K/uL (ref 0.0–0.1)
Basophils Relative: 0 %
Eosinophils Absolute: 0.2 K/uL (ref 0.0–0.5)
Eosinophils Relative: 2 %
HCT: 38.9 % (ref 36.0–46.0)
Hemoglobin: 12.7 g/dL (ref 12.0–15.0)
Immature Granulocytes: 0 %
Lymphocytes Relative: 13 %
Lymphs Abs: 1.2 K/uL (ref 0.7–4.0)
MCH: 28.6 pg (ref 26.0–34.0)
MCHC: 32.6 g/dL (ref 30.0–36.0)
MCV: 87.6 fL (ref 80.0–100.0)
Monocytes Absolute: 0.5 K/uL (ref 0.1–1.0)
Monocytes Relative: 5 %
Neutro Abs: 7.2 K/uL (ref 1.7–7.7)
Neutrophils Relative %: 80 %
Platelets: 338 K/uL (ref 150–400)
RBC: 4.44 MIL/uL (ref 3.87–5.11)
RDW: 13.7 % (ref 11.5–15.5)
WBC: 9.1 K/uL (ref 4.0–10.5)
nRBC: 0 % (ref 0.0–0.2)

## 2024-01-16 LAB — BASIC METABOLIC PANEL WITH GFR
Anion gap: 12 (ref 5–15)
BUN: 13 mg/dL (ref 8–23)
CO2: 25 mmol/L (ref 22–32)
Calcium: 10 mg/dL (ref 8.9–10.3)
Chloride: 104 mmol/L (ref 98–111)
Creatinine, Ser: 0.74 mg/dL (ref 0.44–1.00)
GFR, Estimated: >60 mL/min (ref >60)
Glucose, Bld: 94 mg/dL (ref 70–99)
Potassium: 4.1 mmol/L (ref 3.5–5.1)
Sodium: 141 mmol/L (ref 135–145)

## 2024-01-16 MED ORDER — AMOXICILLIN-POT CLAVULANATE 875-125 MG PO TABS: 1.0000 | ORAL_TABLET | Freq: Two times a day (BID) | ORAL | 0 refills | Status: DC

## 2024-01-16 MED ORDER — AMOXICILLIN-POT CLAVULANATE 875-125 MG PO TABS
1.0000 | ORAL_TABLET | Freq: Once | ORAL | Status: AC
  Administered 2024-01-16: 1 via ORAL
  Filled 2024-01-16: qty 1

## 2024-01-16 MED ORDER — IOHEXOL 300 MG/ML  SOLN
100.0000 mL | Freq: Once | INTRAMUSCULAR | Status: AC | PRN
  Administered 2024-01-16: 75 mL via INTRAVENOUS

## 2024-01-16 MED ORDER — AMOXICILLIN-POT CLAVULANATE 875-125 MG PO TABS: 1.0000 | ORAL_TABLET | Freq: Two times a day (BID) | ORAL | 0 refills | Status: AC

## 2024-01-16 MED ORDER — IBUPROFEN 400 MG PO TABS
600.0000 mg | ORAL_TABLET | Freq: Once | ORAL | Status: AC
  Administered 2024-01-16: 600 mg via ORAL
  Filled 2024-01-16: qty 1

## 2024-01-16 NOTE — Discharge Instructions (Signed)
 It appears you have an infection in the neck soft tissue and possibly of the salivary gland.  The bump you are feeling seems to be a lymph node that is reacting to this infection.  You have been prescribed an antibiotic called Augmentin  to treat this infection. Take this antibiotic 2 times a day for the next 7 days. Take the full course of your antibiotic even if you start feeling better. Antibiotics may cause you to have diarrhea.  You may take up to 1000mg  of tylenol  every 6 hours as needed for pain.  Do not take more then 4g per day.  You may use up to 600mg  ibuprofen  every 6 hours as needed for pain.  Do not exceed 2.4g of ibuprofen  per day.  Please follow-up with your PCP within the next week for recheck of your symptoms.  Please return to the ER for any difficulty swallowing or breathing, fevers, worsening pain, any other new or concerning symptoms

## 2024-01-16 NOTE — ED Notes (Signed)
 Swelling noted on the R lower jaw area.  Pt. Reports pain in the same area.  No injury and no tooth issues.

## 2024-01-16 NOTE — ED Provider Notes (Signed)
 [place] [facility] Provider Note   CSN: [id number] Arrival date & time: 01/16/24  1054     Patient presents with: Lymphadenopathy   Renee Patel is a 68 y.o. female with history of hypertension, hyperlipidemia, presents with concern for a bump to the right side of her neck that has been present for about 1 week.  States her pain has been slowly increasing as the size of the bump has increased.  She denies any fever, chills, cough, ear pain, throat pain, difficulty swallowing.  Denies any recent dental work.  Has tried Advil  and heating packs on the area at home without relief of symptoms.  Was seen by her PCP earlier today who recommended she come to the ER for CT scan.   HPI     Prior to Admission medications   Medication Sig Start Date End Date Taking? Authorizing Provider  amoxicillin -clavulanate (AUGMENTIN ) 875-125 MG tablet Take 1 tablet by mouth 2 (two) times daily. 01/16/24   Damyan Corne, PA-C  benzonatate  (TESSALON ) 100 MG capsule Take 1 capsule (100 mg total) by mouth every 8 (eight) hours. 11/23/19   Caccavale, Sophia, PA-C  fluticasone  (FLONASE ) 50 MCG/ACT nasal spray Place 1 spray into both nostrils daily. 11/23/19   Caccavale, Sophia, PA-C  methocarbamol  (ROBAXIN ) 500 MG tablet Take 1 tablet (500 mg total) by mouth 4 (four) times daily. 05/22/20   Sofia, Leslie K, PA-C  methylPREDNISolone  (MEDROL  DOSEPAK) 4 MG TBPK tablet Day 1: 8mg  before breakfast, 4 mg after lunch, 4 mg after supper, and 8 mg at bedtime Day 2: 4 mg before breakfast, 4 mg after lunch, 4 mg  after supper, and 8 mg  at bedtime Day 3:  4 mg  before breakfast, 4 mg  after lunch, 4 mg after supper, and 4 mg  at bedtime Day 4: 4 mg  before breakfast, 4 mg  after lunch, and 4 mg at bedtime Day 5: 4 mg  before breakfast and 4 mg at bedtime Day 6: 4 mg  before breakfast 07/28/22   Floyd, Dan, DO    Allergies: Nabumetone    Review of Systems  Constitutional:  Negative for  chills and fever.    Updated Vital Signs BP (!) 141/88 (BP Location: Left Arm)   Pulse 71   Temp 97.9 F (36.6 C) (Oral)   Resp 16   SpO2 98%   Physical Exam Vitals and nursing note reviewed.  Constitutional:      General: She is not in acute distress.    Appearance: She is well-developed.  HENT:     Head: Normocephalic and atraumatic.     Right Ear: Tympanic membrane and ear canal normal.     Left Ear: Tympanic membrane and ear canal normal.     Nose: No congestion or rhinorrhea.     Mouth/Throat:     Pharynx: Oropharynx is clear. No oropharyngeal exudate or posterior oropharyngeal erythema.     Comments: Swallowing secretions without difficulty.  No trismus, able to open mouth fully Eyes:     Conjunctiva/sclera: Conjunctivae normal.  Neck:      Comments: Right side of neck just below the right ear with large mobile feeling structure approximately 3 cm in diameter.  No overlying skin change. No areas of fluctuance or drainage. No wounds Cardiovascular:     Rate and Rhythm: Normal rate and regular rhythm.     Heart sounds: No murmur heard. Pulmonary:     Effort: Pulmonary effort is normal.  No respiratory distress.     Breath sounds: Normal breath sounds.  Abdominal:     Palpations: Abdomen is soft.     Tenderness: There is no abdominal tenderness.  Musculoskeletal:        General: No swelling.     Cervical back: Neck supple.  Skin:    General: Skin is warm and dry.     Capillary Refill: Capillary refill takes less than 2 seconds.  Neurological:     Mental Status: She is alert.  Psychiatric:        Mood and Affect: Mood normal.     (all labs ordered are listed, but only abnormal results are displayed) Labs Reviewed  BASIC METABOLIC PANEL WITH GFR  CBC WITH DIFFERENTIAL/PLATELET    EKG: None  Radiology: CT Soft Tissue Neck W Contrast Result Date: 01/16/2024 CLINICAL DATA:  Right-sided bump/knot on neck with associated swelling starting last week. EXAM: CT  NECK WITH CONTRAST TECHNIQUE: Multidetector CT imaging of the neck was performed using the standard protocol following the bolus administration of intravenous contrast. RADIATION DOSE REDUCTION: This exam was performed according to the departmental dose-optimization program which includes automated exposure control, adjustment of the mA and/or kV according to patient size and/or use of iterative reconstruction technique. CONTRAST:  75mL OMNIPAQUE  IOHEXOL  300 MG/ML  SOLN COMPARISON:  None Available. FINDINGS: There is stranding within the subcutaneous tissue underlying the marked area of concern of the right lateral neck along the inferior aspect of the parotid space. Subtle associated skin thickening. There is mild asymmetric prominence of the inferior aspect of the right superficial parotid lobe underlying the marked area of concern with mild adjacent stranding. No abnormal calcification within the right parotid gland. Asymmetric thickening of the right platysma. Pharynx and larynx: The nasopharynx is symmetric. The palatine tonsils are symmetric. The palate, oral cavity, floor of mouth, base of tongue, and epiglottis are unremarkable. No retropharyngeal effusion. The aryepiglottic folds and piriform sinuses are symmetric. Symmetric appearance of the vocal folds. Salivary glands: Right parotid gland as above. There is no evidence of abnormal calcification within the right parotid duct. The left parotid gland is unremarkable. Normal appearance of the submandibular glands. Thyroid: There are multiple thyroid nodules the largest of which measures up to 1.1 cm, no follow-up required. Lymph nodes: There are mildly prominent right level 2 cervical nodes measuring up to 0.8 cm without other suspicious features. Additional right periparotid lymph node measuring up to 0.7 cm in short axis. Vascular: The visualized vasculature in the neck is unremarkable. There is no evidence of significant calcified atherosclerosis. Limited  intracranial: Limited visualization of intracranial structures. The partially visualized posterior fossa is unremarkable. Visualized orbits: The visualized portions of the orbits are unremarkable. Mastoids and visualized paranasal sinuses: Mucosal thickening in the left maxillary sinus and in the bilateral ethmoid sinuses. No air-fluid levels. The visualized mastoid air cells are clear. Skeleton: No acute or aggressive finding. Degenerative changes in the visualized spine. Upper chest: Negative. Other: None. IMPRESSION: Stranding and mild skin thickening in the subcutaneous tissues of the right neck underlying the marked area of concern suggestive of cellulitis. Mild prominence of the underlying superficial lobe of the right parotid gland which may reflect reactive inflammatory changes versus parotitis. Mildly prominent right periparotid and right level 2 cervical lymph nodes, likely reactive. Electronically Signed   By: Donnice Mania M.D.   On: 01/16/2024 14:19     Procedures   Medications Ordered in the ED  ibuprofen  (ADVIL ) tablet 600 mg (  has no administration in time range)  amoxicillin -clavulanate (AUGMENTIN ) 875-125 MG per tablet 1 tablet (has no administration in time range)  iohexol  (OMNIPAQUE ) 300 MG/ML solution 100 mL (75 mLs Intravenous Contrast Given 01/16/24 1320)                                    Medical Decision Making Amount and/or Complexity of Data Reviewed Labs: ordered. Radiology: ordered.  Risk Prescription drug management.     Differential diagnosis includes but is not limited to lymphadenopathy, cyst, abscess, malignancy  ED Course:  Upon initial evaluation, patient is well appearing, no acute distress. Stable vitals aside from mildly elevated blood pressure of 141/88.  On exam, has a mobile feeling bump underneath the right upper neck, approximately 3 cm in diameter.  No overlying skin change or wound to suggest infection.  Area does not feel fluctuant, low  concern for abscess.  Labs Ordered: I Ordered, and personally interpreted labs.  The pertinent results include:   CBC without leukocytosis BMP within normal limits  Imaging Studies ordered: I ordered imaging studies including soft tissue neck I independently visualized the imaging with scope of interpretation limited to determining acute life threatening conditions related to emergency care. Imaging showed  IMPRESSION: Stranding and mild skin thickening in the subcutaneous tissues of the right neck underlying the marked area of concern suggestive of cellulitis. Mild prominence of the underlying superficial lobe of the right parotid gland which may reflect reactive inflammatory changes versus parotitis.   Mildly prominent right periparotid and right level 2 cervical lymph nodes, likely reactive. I agree with the radiologist interpretation    Medications Given: Ibuprofen  Augmentin   Upon re-evaluation, patient remains well appearing.  Her CT scan showed some stranding of the subcutaneous tissues of the right neck concerning for possible cellulitis.  Also appears that she may have parotitis versus inflammatory change of the right parotid gland.  This seems to be what is causing the lymphadenopathy noted on the right side of the neck.  She does not have any signs of systemic illness such as fever, tachycardia, no leukocytosis.  She is able to swallow without difficulty.  Feel she is appropriate for outpatient treatment with course of Augmentin .  Will have her follow-up closely with her PCP within the next week.  Stable and appropriate for discharge home    Impression: Lymphadenopathy Cellulitis  Disposition:  The patient was discharged home with instructions to take 7-day course of Augmentin  as prescribed.  May take Tylenol  and ibuprofen  as needed for pain.  Follow-up with PCP within the next week for recheck of symptoms. Return precautions given.    Record Review: External  records from outside source obtained and reviewed including PCP note from today     This chart was dictated using voice recognition software, Dragon. Despite the best efforts of this provider to proofread and correct errors, errors may still occur which can change documentation meaning.       Final diagnoses:  Acute parotitis  Lymphadenopathy    ED Discharge Orders          Ordered    amoxicillin -clavulanate (AUGMENTIN ) 875-125 MG tablet  Every 12 hours,   Status:  Discontinued        01/16/24 1438    amoxicillin -clavulanate (AUGMENTIN ) 875-125 MG tablet  2 times daily        01/16/24 1438  Renee Palma, PA-C 01/16/24 1442    Randol Simmonds, MD 01/17/24 [id number]

## 2024-01-16 NOTE — ED Triage Notes (Signed)
 Pt presents with swelling behind right ear X 1 week. Has gotten progressively bigger and more pain. Saw PCP today and wanted her to be have a CT scan.

## 2024-01-22 DIAGNOSIS — K1120 Sialoadenitis, unspecified: Secondary | ICD-10-CM | POA: Diagnosis not present

## 2024-01-22 DIAGNOSIS — N644 Mastodynia: Secondary | ICD-10-CM | POA: Diagnosis not present

## 2024-02-07 DIAGNOSIS — K1120 Sialoadenitis, unspecified: Secondary | ICD-10-CM | POA: Diagnosis not present

## 2024-02-10 DIAGNOSIS — G4733 Obstructive sleep apnea (adult) (pediatric): Secondary | ICD-10-CM | POA: Diagnosis not present

## 2024-02-12 DIAGNOSIS — M65331 Trigger finger, right middle finger: Secondary | ICD-10-CM | POA: Diagnosis not present

## 2024-02-25 DIAGNOSIS — R002 Palpitations: Secondary | ICD-10-CM | POA: Diagnosis not present

## 2024-03-10 DIAGNOSIS — H35371 Puckering of macula, right eye: Secondary | ICD-10-CM | POA: Diagnosis not present

## 2024-03-10 DIAGNOSIS — H2513 Age-related nuclear cataract, bilateral: Secondary | ICD-10-CM | POA: Diagnosis not present

## 2024-03-10 DIAGNOSIS — H25013 Cortical age-related cataract, bilateral: Secondary | ICD-10-CM | POA: Diagnosis not present

## 2024-05-12 DIAGNOSIS — R051 Acute cough: Secondary | ICD-10-CM | POA: Diagnosis not present

## 2024-05-12 DIAGNOSIS — J329 Chronic sinusitis, unspecified: Secondary | ICD-10-CM | POA: Diagnosis not present

## 2024-05-12 DIAGNOSIS — R0982 Postnasal drip: Secondary | ICD-10-CM | POA: Diagnosis not present

## 2024-05-27 DIAGNOSIS — E042 Nontoxic multinodular goiter: Secondary | ICD-10-CM | POA: Diagnosis not present

## 2024-05-27 DIAGNOSIS — Z23 Encounter for immunization: Secondary | ICD-10-CM | POA: Diagnosis not present

## 2024-05-27 DIAGNOSIS — M1611 Unilateral primary osteoarthritis, right hip: Secondary | ICD-10-CM | POA: Diagnosis not present

## 2024-05-27 DIAGNOSIS — Z0000 Encounter for general adult medical examination without abnormal findings: Secondary | ICD-10-CM | POA: Diagnosis not present

## 2024-05-27 DIAGNOSIS — I10 Essential (primary) hypertension: Secondary | ICD-10-CM | POA: Diagnosis not present

## 2024-05-27 DIAGNOSIS — E7800 Pure hypercholesterolemia, unspecified: Secondary | ICD-10-CM | POA: Diagnosis not present
